# Patient Record
Sex: Female | Born: 2000 | Race: Black or African American | Hispanic: No | Marital: Single | Smoking: Never smoker
Health system: Southern US, Community
[De-identification: ages and names within clinical notes are randomized; demographics above are authoritative.]

## PROBLEM LIST (undated history)

## (undated) DIAGNOSIS — M549 Dorsalgia, unspecified: Secondary | ICD-10-CM

## (undated) DIAGNOSIS — R519 Headache, unspecified: Secondary | ICD-10-CM

## (undated) DIAGNOSIS — R51 Headache: Secondary | ICD-10-CM

## (undated) DIAGNOSIS — T50902D Poisoning by unspecified drugs, medicaments and biological substances, intentional self-harm, subsequent encounter: Secondary | ICD-10-CM

## (undated) DIAGNOSIS — R252 Cramp and spasm: Secondary | ICD-10-CM

---
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
## 2021-01-29 ENCOUNTER — Ambulatory Visit (HOSPITAL_COMMUNITY)
Admission: EM | Admit: 2021-01-29 | Discharge: 2021-01-29 | Disposition: A | Discharge status: Home / Self Care | Payer: Federal, State, Local not specified - PPO | Attending: Internal Medicine | Admitting: Internal Medicine

## 2021-01-29 ENCOUNTER — Encounter (HOSPITAL_COMMUNITY): Payer: Self-pay

## 2021-01-29 ENCOUNTER — Other Ambulatory Visit: Payer: Self-pay

## 2021-01-29 DIAGNOSIS — J302 Other seasonal allergic rhinitis: Secondary | ICD-10-CM | POA: Diagnosis present

## 2021-01-29 DIAGNOSIS — Z1152 Encounter for screening for COVID-19: Secondary | ICD-10-CM | POA: Diagnosis present

## 2021-01-29 HISTORY — DX: Dorsalgia, unspecified: M54.9

## 2021-01-29 HISTORY — DX: Cramp and spasm: R25.2

## 2021-01-29 NOTE — ED Triage Notes (Signed)
Pt c/o headache, itchy throat and congestion since yesterday evening. States flu/covid have been going around at her workplace. States did home covid test yesterday and saw possible faded positive line on test.

## 2021-01-29 NOTE — Discharge Instructions (Addendum)
As we discussed, I feel your symptoms are related to seasonal allergies.  Please begin taking over-the-counter Claritin (ok for generic brand) OR Zyrtec once daily while symptoms persist.  To be extra cautious, I will screen you today for COVID-19.  Results should be available in your MyChart tomorrow.  If COVID-positive then you will need to isolate as we discussed.

## 2021-01-29 NOTE — ED Notes (Signed)
While reviewing medical history pt states that she gets cramping in her hands, cramps in her legs, and numbness in right leg only. Pt states she has been seen by her primary care doctor for all of these and they have not found the cause.

## 2021-01-29 NOTE — ED Provider Notes (Signed)
[facility]    CSN: [id number] Arrival date & time: 01/29/21  0808      History   Chief Complaint Chief Complaint  Patient presents with  . Headache  . Nasal Congestion  . itchy throat    HPI Kylie Bishop is a 20 y.o. female otherwise healthy presents to urgent care today with complaints of headache, rhinorrhea, sneezing and scratchy throat.  Patient reports sudden onset of symptoms while at work yesterday.  Patient denies any recent fever or chills, chest pain, shortness of breath, cough.  Patient with no known COVID exposure but does work at a [employer].   Past Medical History:  Diagnosis Date  . Back pain   . Cramp in limb    pt reports being seen in past for cramping in both legs, numbness in right leg  . Cramping of hands   . Headache   . Suicidal overdose, subsequent encounter 03/29/2017    Patient Active Problem List   Diagnosis Date Noted  . Suicidal overdose, subsequent encounter 03/29/2017  . MDD (major depressive disorder), recurrent episode, severe (HCC) 03/28/2017    History reviewed. No pertinent surgical history.  OB History   No obstetric history on file.      Home Medications    Prior to Admission medications   Medication Sig Start Date End Date Taking? Authorizing Provider  FLUoxetine (PROZAC) 20 MG capsule Take 1 capsule (20 mg total) by mouth daily. 04/01/17  Yes Thedora Hinders, MD  ibuprofen (ADVIL,MOTRIN) 400 MG tablet Take 1 tablet (400 mg total) by mouth every 6 (six) hours as needed for headache, mild pain or moderate pain. 12/29/17  Yes Scoville, Nadara Mustard, NP    Family History Family History  Problem Relation Age of Onset  . Healthy Mother   . Healthy Father     Social History Social History   Tobacco Use  . Smoking status: Never Smoker  . Smokeless tobacco: Never Used  Substance Use Topics  . Alcohol use: Yes    Comment: occasionally  . Drug use: No     Allergies   Patient has no known  allergies.   Review of Systems As stated in HPI otherwise negative   Physical Exam Triage Vital Signs ED Triage Vitals  Enc Vitals Group     BP 01/29/21 0826 108/72     Pulse Rate 01/29/21 0826 70     Resp 01/29/21 0826 18     Temp 01/29/21 0826 98.1 F (36.7 C)     Temp src --      SpO2 01/29/21 0826 100 %     Weight --      Height --      Head Circumference --      Peak Flow --      Pain Score 01/29/21 0821 0     Pain Loc --      Pain Edu? --      Excl. in GC? --    No data found.  Updated Vital Signs BP 108/72   Pulse 70   Temp 98.1 F (36.7 C)   Resp 18   LMP 01/08/2021   SpO2 100%   Visual Acuity Right Eye Distance:   Left Eye Distance:   Bilateral Distance:    Right Eye Near:   Left Eye Near:    Bilateral Near:     Physical Exam Constitutional:      General: She is not in acute distress.  Appearance: She is well-developed and normal weight. She is not ill-appearing or toxic-appearing.  HENT:     Head: Normocephalic.     Mouth/Throat:     Mouth: Mucous membranes are moist.     Comments: Mild posterior pharyngeal erythema without any swelling or exudate.  No suspicious lesions Eyes:     General: No scleral icterus.    Extraocular Movements: Extraocular movements intact.  Cardiovascular:     Rate and Rhythm: Normal rate and regular rhythm.  Pulmonary:     Effort: Pulmonary effort is normal.     Breath sounds: Normal breath sounds. No wheezing, rhonchi or rales.  Abdominal:     General: Bowel sounds are normal.     Palpations: Abdomen is soft.     Tenderness: There is no abdominal tenderness.  Musculoskeletal:     Cervical back: Normal range of motion and neck supple.  Lymphadenopathy:     Cervical: No cervical adenopathy.  Skin:    General: Skin is warm and dry.  Neurological:     Mental Status: She is alert.  Psychiatric:        Mood and Affect: Mood normal.        Behavior: Behavior normal.      UC Treatments / Results   Labs (all labs ordered are listed, but only abnormal results are displayed) Labs Reviewed  SARS CORONAVIRUS 2 (TAT 6-24 HRS)    EKG   Radiology No results found.  Procedures Procedures (including critical care time)  Medications Ordered in UC Medications - No data to display  Initial Impression / Assessment and Plan / UC Course  I have reviewed the triage vital signs and the nursing notes.  Pertinent labs & imaging results that were available during my care of the patient were reviewed by me and considered in my medical decision making (see chart for details).  Allergic rhinitis -Onset of symptoms day prior with frontal headache, rhinorrhea, sneezing and itchy throat. -History and exam consistent with allergic rhinitis.  No findings suggestive of bacterial etiology.  Patient afebrile and nontoxic-appearing -Low suspicion for COVID-19 though will screen based on recommendations.  Isolation precautions discussed -OTC Claritin or Zyrtec daily -f/u up PCP or return for worsening symptoms  Final Clinical Impressions(s) / UC Diagnoses   Final diagnoses:  Seasonal allergic rhinitis, unspecified trigger  Encounter for screening for COVID-19     Discharge Instructions     As we discussed, I feel your symptoms are related to seasonal allergies.  Please begin taking over-the-counter Claritin (ok for generic brand) OR Zyrtec once daily while symptoms persist.  To be extra cautious, I will screen you today for COVID-19.  Results should be available in your MyChart tomorrow.  If COVID-positive then you will need to isolate as we discussed.    ED Prescriptions    None     PDMP not reviewed this encounter.   Rolla Etienne, NP 01/29/21 [id number]

## 2021-01-30 LAB — SARS CORONAVIRUS 2 (TAT 6-24 HRS): SARS Coronavirus 2: POSITIVE — AB

## 2021-08-01 ENCOUNTER — Ambulatory Visit (HOSPITAL_COMMUNITY)
Admission: EM | Admit: 2021-08-01 | Discharge: 2021-08-01 | Disposition: A | Discharge status: Home / Self Care | Payer: Federal, State, Local not specified - PPO | Attending: Student | Admitting: Student

## 2021-08-01 ENCOUNTER — Encounter (HOSPITAL_COMMUNITY): Payer: Self-pay | Admitting: Emergency Medicine

## 2021-08-01 ENCOUNTER — Other Ambulatory Visit: Payer: Self-pay

## 2021-08-01 DIAGNOSIS — R42 Dizziness and giddiness: Secondary | ICD-10-CM | POA: Diagnosis not present

## 2021-08-01 LAB — CBG MONITORING, ED: Glucose-Capillary: 121 mg/dL — ABNORMAL HIGH (ref 70–99)

## 2021-08-01 LAB — POC INFLUENZA A AND B ANTIGEN (URGENT CARE ONLY)
INFLUENZA A ANTIGEN, POC: POSITIVE — AB
INFLUENZA B ANTIGEN, POC: NEGATIVE

## 2021-08-01 NOTE — Discharge Instructions (Addendum)
-  Please follow-up with your PCP if symptoms persist, or ED if symptoms worsen

## 2021-08-01 NOTE — ED Triage Notes (Signed)
Wile at work started feeling weak and dizzy.  She sat down and then hands and feet were numb.  Reports feeling "really weird" all over

## 2021-08-01 NOTE — ED Provider Notes (Signed)
[place]    CSN: [id number] Arrival date & time: 08/01/21  1549      History   Chief Complaint Chief Complaint  Patient presents with   Dizziness   Weakness    HPI BRILYN PHON is a 20 y.o. female presenting with dizziness and weakness x6 hours.  Describes 6 hours of dizziness and weakness that started while she was at work.  States she was overcome with body aches and tingling in the feet, she sat down and the episode passed though she still does have some tingling.  Has not taken any medications for the symptoms.  Feeling well otherwise, denies fever/chills, cough, congestion., SOB, CP.   HPI  Past Medical History:  Diagnosis Date   Back pain    Cramp in limb    pt reports being seen in past for cramping in both legs, numbness in right leg   Cramping of hands    Headache    Suicidal overdose, subsequent encounter 03/29/2017    Patient Active Problem List   Diagnosis Date Noted   Suicidal overdose, subsequent encounter 03/29/2017   MDD (major depressive disorder), recurrent episode, severe ([place]) 03/28/2017    History reviewed. No pertinent surgical history.  OB History   No obstetric history on file.      Home Medications    Prior to Admission medications   Medication Sig Start Date End Date Taking? Authorizing Provider  FLUoxetine (PROZAC) 20 MG capsule Take 1 capsule (20 mg total) by mouth daily. Patient not taking: Reported on 08/01/2021 04/01/17   Philipp Ovens, MD  ibuprofen (ADVIL,MOTRIN) 400 MG tablet Take 1 tablet (400 mg total) by mouth every 6 (six) hours as needed for headache, mild pain or moderate pain. 12/29/17   Jean Rosenthal, NP    Family History Family History  Problem Relation Age of Onset   Healthy Mother    Healthy Father     Social History Social History   Tobacco Use   Smoking status: Never   Smokeless tobacco: Never  Vaping Use   Vaping Use: Never used  Substance Use Topics   Alcohol use:  Yes    Comment: occasionally   Drug use: No     Allergies   Patient has no known allergies.   Review of Systems Review of Systems  Constitutional:  Negative for appetite change, chills and fever.  HENT:  Negative for congestion, ear pain, rhinorrhea, sinus pressure, sinus pain and sore throat.   Eyes:  Negative for redness and visual disturbance.  Respiratory:  Negative for cough, chest tightness, shortness of breath and wheezing.   Cardiovascular:  Negative for chest pain and palpitations.  Gastrointestinal:  Negative for abdominal pain, constipation, diarrhea, nausea and vomiting.  Genitourinary:  Negative for dysuria, frequency and urgency.  Musculoskeletal:  Negative for myalgias.  Neurological:  Positive for dizziness. Negative for weakness and headaches.  Psychiatric/Behavioral:  Negative for confusion.   All other systems reviewed and are negative.   Physical Exam Triage Vital Signs ED Triage Vitals  Enc Vitals Group     BP 08/01/21 1553 129/81     Pulse Rate 08/01/21 1553 84     Resp 08/01/21 1553 (!) 22     Temp --      Temp src --      SpO2 08/01/21 1553 100 %     Weight --      Height --      Head Circumference --  Peak Flow --      Pain Score 08/01/21 1556 6     Pain Loc --      Pain Edu? --      Excl. in [place]? --    No data found.  Updated Vital Signs BP 129/81 (BP Location: Right Arm)   Pulse 84   Resp (!) 22   SpO2 100%   Visual Acuity Right Eye Distance:   Left Eye Distance:   Bilateral Distance:    Right Eye Near:   Left Eye Near:    Bilateral Near:     Physical Exam Vitals reviewed.  Constitutional:      General: She is not in acute distress.    Appearance: Normal appearance. She is not ill-appearing.  HENT:     Head: Normocephalic and atraumatic.     Right Ear: Tympanic membrane, ear canal and external ear normal. No tenderness. No middle ear effusion. There is no impacted cerumen. Tympanic membrane is not perforated,  erythematous, retracted or bulging.     Left Ear: Tympanic membrane, ear canal and external ear normal. No tenderness.  No middle ear effusion. There is no impacted cerumen. Tympanic membrane is not perforated, erythematous, retracted or bulging.     Nose: Nose normal. No congestion.     Mouth/Throat:     Mouth: Mucous membranes are moist.     Pharynx: Uvula midline. No oropharyngeal exudate or posterior oropharyngeal erythema.  Eyes:     Extraocular Movements: Extraocular movements intact.     Pupils: Pupils are equal, round, and reactive to light.  Cardiovascular:     Rate and Rhythm: Normal rate and regular rhythm.     Heart sounds: Normal heart sounds.  Pulmonary:     Effort: Pulmonary effort is normal.     Breath sounds: Normal breath sounds. No decreased breath sounds, wheezing, rhonchi or rales.  Abdominal:     Palpations: Abdomen is soft.     Tenderness: There is no abdominal tenderness. There is no guarding or rebound.  Lymphadenopathy:     Cervical: No cervical adenopathy.     Right cervical: No superficial cervical adenopathy.    Left cervical: No superficial cervical adenopathy.  Neurological:     General: No focal deficit present.     Mental Status: She is alert and oriented to person, place, and time.     Comments: CN 2-12 grossly intact, PERRLA, EOMI, negative rhomberg, negative fingers to thumb  Psychiatric:        Mood and Affect: Mood normal.        Behavior: Behavior normal.        Thought Content: Thought content normal.        Judgment: Judgment normal.     UC Treatments / Results  Labs (all labs ordered are listed, but only abnormal results are displayed) Labs Reviewed  POC INFLUENZA A AND B ANTIGEN (URGENT CARE ONLY) - Abnormal; Notable for the following components:      Result Value   INFLUENZA A ANTIGEN, POC POSITIVE (*)    All other components within normal limits  CBG MONITORING, ED - Abnormal; Notable for the following components:    Glucose-Capillary 121 (*)    All other components within normal limits    EKG   Radiology No results found.  Procedures Procedures (including critical care time)  Medications Ordered in UC Medications - No data to display  Initial Impression / Assessment and Plan / UC Course  I have reviewed the  triage vital signs and the nursing notes.  Pertinent labs & imaging results that were available during my care of the patient were reviewed by me and considered in my medical decision making (see chart for details).     This patient is a very pleasant 20 y.o. year old female presenting with influenza A. Today this pt is afebrile nontachycardic nontachypneic, oxygenating well on room air, no wheezes rhonchi or rales.   ED return precautions discussed. Patient verbalizes understanding and agreement.  .   Final Clinical Impressions(s) / UC Diagnoses   Final diagnoses:  Lightheaded     Discharge Instructions      -Please follow-up with your PCP if symptoms persist, or ED if symptoms worsen   ED Prescriptions   None    PDMP not reviewed this encounter.   Rhys Martini, PA-C 08/01/21 [phone]

## 2022-02-26 ENCOUNTER — Other Ambulatory Visit: Payer: Self-pay

## 2022-02-26 ENCOUNTER — Encounter (HOSPITAL_BASED_OUTPATIENT_CLINIC_OR_DEPARTMENT_OTHER): Payer: Self-pay | Admitting: *Deleted

## 2022-02-26 DIAGNOSIS — S134XXA Sprain of ligaments of cervical spine, initial encounter: Secondary | ICD-10-CM | POA: Diagnosis not present

## 2022-02-26 DIAGNOSIS — S199XXA Unspecified injury of neck, initial encounter: Secondary | ICD-10-CM | POA: Diagnosis present

## 2022-02-26 DIAGNOSIS — Y92410 Unspecified street and highway as the place of occurrence of the external cause: Secondary | ICD-10-CM | POA: Diagnosis not present

## 2022-02-26 DIAGNOSIS — S339XXA Sprain of unspecified parts of lumbar spine and pelvis, initial encounter: Secondary | ICD-10-CM | POA: Insufficient documentation

## 2022-02-26 DIAGNOSIS — S29019A Strain of muscle and tendon of unspecified wall of thorax, initial encounter: Secondary | ICD-10-CM | POA: Diagnosis not present
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
## 2022-02-27 ENCOUNTER — Emergency Department (HOSPITAL_BASED_OUTPATIENT_CLINIC_OR_DEPARTMENT_OTHER): Payer: Federal, State, Local not specified - PPO | Admitting: Radiology

## 2022-02-27 ENCOUNTER — Emergency Department (HOSPITAL_BASED_OUTPATIENT_CLINIC_OR_DEPARTMENT_OTHER)
Admission: EM | Admit: 2022-02-27 | Discharge: 2022-02-27 | Disposition: A | Discharge status: Home / Self Care | Payer: Federal, State, Local not specified - PPO | Attending: Emergency Medicine | Admitting: Emergency Medicine

## 2022-02-27 DIAGNOSIS — S139XXA Sprain of joints and ligaments of unspecified parts of neck, initial encounter: Secondary | ICD-10-CM

## 2022-02-27 DIAGNOSIS — S335XXA Sprain of ligaments of lumbar spine, initial encounter: Secondary | ICD-10-CM

## 2022-02-27 DIAGNOSIS — S29019A Strain of muscle and tendon of unspecified wall of thorax, initial encounter: Secondary | ICD-10-CM

## 2022-02-27 LAB — HCG, SERUM, QUALITATIVE: Preg, Serum: NEGATIVE
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
## 2022-02-27 NOTE — Discharge Instructions (Signed)
Apply ice for 30 minutes at a time, 4 times a day.  Take ibuprofen or naproxen as needed for pain.  If you need additional pain relief, add acetaminophen.  When you combine acetaminophen and ibuprofen or naproxen, you get better pain relief and you get from taking either medication by itself.
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
## 2022-07-05 ENCOUNTER — Ambulatory Visit (HOSPITAL_COMMUNITY)
Admission: EM | Admit: 2022-07-05 | Discharge: 2022-07-05 | Disposition: A | Discharge status: Home / Self Care | Payer: Federal, State, Local not specified - PPO | Attending: Internal Medicine | Admitting: Internal Medicine

## 2022-07-05 ENCOUNTER — Encounter (HOSPITAL_COMMUNITY): Payer: Self-pay | Admitting: Emergency Medicine

## 2022-07-05 DIAGNOSIS — N3001 Acute cystitis with hematuria: Secondary | ICD-10-CM | POA: Diagnosis present

## 2022-07-05 DIAGNOSIS — J300 Vasomotor rhinitis: Secondary | ICD-10-CM | POA: Insufficient documentation

## 2022-07-05 LAB — POCT URINALYSIS DIPSTICK, ED / UC
Glucose, UA: NEGATIVE mg/dL
Ketones, ur: 40 mg/dL — AB
Nitrite: NEGATIVE
Protein, ur: 100 mg/dL — AB
Specific Gravity, Urine: 1.015 (ref 1.005–1.030)
Urobilinogen, UA: 0.2 mg/dL (ref 0.0–1.0)
pH: 6.5 (ref 5.0–8.0)

## 2022-07-05 MED ORDER — FLUTICASONE PROPIONATE 50 MCG/ACT NA SUSP: 1.0000 | Freq: Every day | NASAL | 0 refills | Status: AC

## 2022-07-05 MED ORDER — CEPHALEXIN 500 MG PO CAPS: 500.0000 mg | ORAL_CAPSULE | Freq: Two times a day (BID) | ORAL | 0 refills | Status: AC

## 2022-07-05 NOTE — ED Triage Notes (Signed)
Around Thursday finished menstrual cycle and having urinary frequency and spotting. Denies pain with urination. Pt also c/o sinus symptoms that started on Saturday. Took nasal decongestant OTC starting yesterday.

## 2022-07-05 NOTE — Discharge Instructions (Addendum)
Please use medications as prescribed Increase oral fluid intake Humidifier use will help with nasal congestion No indication for viral testing Return to urgent care if symptoms worsen.

## 2022-07-06 LAB — URINE CULTURE

## 2022-07-06 NOTE — ED Provider Notes (Signed)
[facility]    CSN: [id number] Arrival date & time: 07/05/22  1001      History   Chief Complaint Chief Complaint  Patient presents with   Urinary Frequency   Facial Pain    HPI Kylie Bishop is a 21 y.o. female comes to urgent care with urinary frequency and blood in urine.  Patient recently finished her menses.  No dysuria.  No fever or chills.  No flank pain.  No nausea or vomiting.    Patient also complains of nasal congestion with no fever or chills.  No sore throat.  No generalized body aches.  Symptoms started yesterday after she slept with the windows open and the fan blowing on her face.  She took nasal decongestant with no improvement in symptoms.  Nasal congestion started yesterday.  Patient denies tobacco use or toxic inhalational exposure. HPI  Past Medical History:  Diagnosis Date   Back pain    Cramp in limb    pt reports being seen in past for cramping in both legs, numbness in right leg   Cramping of hands    Headache    Suicidal overdose, subsequent encounter 03/29/2017    Patient Active Problem List   Diagnosis Date Noted   Suicidal overdose, subsequent encounter 03/29/2017   MDD (major depressive disorder), recurrent episode, severe ([place]) 03/28/2017    History reviewed. No pertinent surgical history.  OB History   No obstetric history on file.      Home Medications    Prior to Admission medications   Medication Sig Start Date End Date Taking? Authorizing Provider  cephALEXin (KEFLEX) 500 MG capsule Take 1 capsule (500 mg total) by mouth 2 (two) times daily for 5 days. 07/05/22 07/10/22 Yes Grettel Rames, Myrene Galas, MD  fluticasone (FLONASE) 50 MCG/ACT nasal spray Place 1 spray into both nostrils daily. 07/05/22  Yes Shanaye Rief, Myrene Galas, MD  gabapentin (NEURONTIN) 300 MG capsule Take 1 capsule by mouth as needed. 09/07/21  Yes [provider]  norelgestromin-ethinyl estradiol [name]) 150-35 MCG/24HR transdermal patch Place 1 patch  onto the skin once a week. 05/24/22  Yes [provider]  ibuprofen (ADVIL,MOTRIN) 400 MG tablet Take 1 tablet (400 mg total) by mouth every 6 (six) hours as needed for headache, mild pain or moderate pain. 12/29/17   Jean Rosenthal, NP    Family History Family History  Problem Relation Age of Onset   Healthy Mother    Healthy Father     Social History Social History   Tobacco Use   Smoking status: Never   Smokeless tobacco: Never  Vaping Use   Vaping Use: Never used  Substance Use Topics   Alcohol use: Yes    Comment: occasionally   Drug use: No     Allergies   Patient has no known allergies.   Review of Systems Review of Systems  Constitutional: Negative.   HENT:  Positive for congestion. Negative for sinus pressure, sinus pain and sore throat.   Respiratory:  Negative for apnea, cough and shortness of breath.   Genitourinary:  Positive for frequency, hematuria and urgency. Negative for difficulty urinating and dysuria.  Neurological: Negative.      Physical Exam Triage Vital Signs ED Triage Vitals  Enc Vitals Group     BP 07/05/22 1132 113/66     Pulse Rate 07/05/22 1132 96     Resp 07/05/22 1132 15     Temp 07/05/22 1132 99.9 F (37.7 C)  Temp Source 07/05/22 1132 Oral     SpO2 07/05/22 1132 98 %     Weight --      Height --      Head Circumference --      Peak Flow --      Pain Score 07/05/22 1129 0     Pain Loc --      Pain Edu? --      Excl. in [place]? --    No data found.  Updated Vital Signs BP 113/66 (BP Location: Right Arm)   Pulse 96   Temp 99.9 F (37.7 C) (Oral)   Resp 15   LMP 06/27/2022   SpO2 98%   Visual Acuity Right Eye Distance:   Left Eye Distance:   Bilateral Distance:    Right Eye Near:   Left Eye Near:    Bilateral Near:     Physical Exam Vitals and nursing note reviewed.  Constitutional:      General: She is not in acute distress.    Appearance: She is not ill-appearing.  HENT:     Mouth/Throat:      Pharynx: No posterior oropharyngeal erythema.  Eyes:     Pupils: Pupils are equal, round, and reactive to light.  Cardiovascular:     Rate and Rhythm: Normal rate and regular rhythm.     Pulses: Normal pulses.     Heart sounds: Normal heart sounds.  Pulmonary:     Effort: Pulmonary effort is normal.     Breath sounds: Normal breath sounds.  Abdominal:     General: Bowel sounds are normal.     Palpations: Abdomen is soft.  Skin:    General: Skin is warm.  Neurological:     Mental Status: She is alert.      UC Treatments / Results  Labs (all labs ordered are listed, but only abnormal results are displayed) Labs Reviewed  URINE CULTURE - Abnormal; Notable for the following components:      Result Value   Culture MULTIPLE SPECIES PRESENT, SUGGEST RECOLLECTION (*)    All other components within normal limits  POCT URINALYSIS DIPSTICK, ED / UC - Abnormal; Notable for the following components:   Bilirubin Urine SMALL (*)    Ketones, ur 40 (*)    Hgb urine dipstick LARGE (*)    Protein, ur 100 (*)    Leukocytes,Ua MODERATE (*)    All other components within normal limits    EKG   Radiology No results found.  Procedures Procedures (including critical care time)  Medications Ordered in UC Medications - No data to display  Initial Impression / Assessment and Plan / UC Course  I have reviewed the triage vital signs and the nursing notes.  Pertinent labs & imaging results that were available during my care of the patient were reviewed by me and considered in my medical decision making (see chart for details).     1.  Vasomotor rhinitis: Fluticasone nasal spray Humidifier use will help with nasal congestion No indication for COVID or flu testing Return precautions given  2.  Acute cystitis with hematuria: Point-of-care urinalysis is remarkable for hemoglobin, moderate leukocyte esterase Urine cultures have been sent Patient is advised to increase oral fluid  intake Keflex 500 mg twice daily for 5 days Return precautions given.  Final Clinical Impressions(s) / UC Diagnoses   Final diagnoses:  Vasomotor rhinitis  Acute cystitis with hematuria     Discharge Instructions      Please use  medications as prescribed Increase oral fluid intake Humidifier use will help with nasal congestion No indication for viral testing Return to urgent care if symptoms worsen.   ED Prescriptions     Medication Sig Dispense Auth. Provider   fluticasone (FLONASE) 50 MCG/ACT nasal spray Place 1 spray into both nostrils daily. 16 g Rashi Giuliani, Britta Mccreedy, MD   cephALEXin (KEFLEX) 500 MG capsule Take 1 capsule (500 mg total) by mouth 2 (two) times daily for 5 days. 10 capsule Drayden Lukas, Britta Mccreedy, MD      PDMP not reviewed this encounter.   Merrilee Jansky, MD 07/06/22 [phone]

## 2022-10-09 ENCOUNTER — Telehealth (HOSPITAL_COMMUNITY): Payer: Self-pay | Admitting: Family Medicine

## 2022-10-09 ENCOUNTER — Encounter (HOSPITAL_COMMUNITY): Payer: Self-pay

## 2022-10-09 ENCOUNTER — Telehealth (HOSPITAL_COMMUNITY): Payer: Self-pay | Admitting: Emergency Medicine

## 2022-10-09 ENCOUNTER — Ambulatory Visit (HOSPITAL_COMMUNITY)
Admission: EM | Admit: 2022-10-09 | Discharge: 2022-10-09 | Disposition: A | Discharge status: Home / Self Care | Payer: Federal, State, Local not specified - PPO | Attending: Family Medicine | Admitting: Family Medicine

## 2022-10-09 DIAGNOSIS — J069 Acute upper respiratory infection, unspecified: Secondary | ICD-10-CM

## 2022-10-09 DIAGNOSIS — J029 Acute pharyngitis, unspecified: Secondary | ICD-10-CM | POA: Diagnosis not present

## 2022-10-09 LAB — POCT RAPID STREP A, ED / UC: Streptococcus, Group A Screen (Direct): NEGATIVE

## 2022-10-09 MED ORDER — PROMETHAZINE-DM 6.25-15 MG/5ML PO SYRP: 5.0000 mL | ORAL_SOLUTION | Freq: Four times a day (QID) | ORAL | 0 refills | Status: AC | PRN

## 2022-10-09 MED ORDER — LIDOCAINE VISCOUS HCL 2 % MT SOLN: 5.0000 mL | Freq: Four times a day (QID) | OROMUCOSAL | 0 refills | Status: DC | PRN

## 2022-10-09 MED ORDER — LIDOCAINE VISCOUS HCL 2 % MT SOLN: 5.0000 mL | Freq: Four times a day (QID) | OROMUCOSAL | 0 refills | Status: AC | PRN

## 2022-10-09 NOTE — ED Triage Notes (Signed)
Pt is here for chest congestion, sore throat, body aches, gassy, cough x3 days

## 2022-10-09 NOTE — ED Provider Notes (Signed)
[place]   [id number] 10/09/22 Arrival Time: 1026  ASSESSMENT & PLAN:  1. Sore throat   2. Viral URI with cough    Discussed typical duration of likely viral illness. Results for orders placed or performed during the hospital encounter of 10/09/22  POCT Rapid Strep A (ED/UC)  Result Value Ref Range   Streptococcus, Group A Screen (Direct) NEGATIVE NEGATIVE  Throat culture sent/pending.  OTC symptom care as needed.  Meds ordered this encounter  Medications   magic mouthwash (lidocaine, diphenhydrAMINE, alum & mag hydroxide) suspension    Sig: Swish and spit 5 mLs 4 (four) times daily as needed for mouth pain.    Dispense:  360 mL    Refill:  0   promethazine-dextromethorphan (PROMETHAZINE-DM) 6.25-15 MG/5ML syrup    Sig: Take 5 mLs by mouth 4 (four) times daily as needed for cough.    Dispense:  118 mL    Refill:  0     Discharge Instructions      You may use over the counter ibuprofen or acetaminophen as needed.  For a sore throat, over the counter products such as [employer] Peroxyl Mouth Sore Rinse or Chloraseptic Sore Throat Spray may provide some temporary relief. Your rapid strep test was negative today. We have sent your throat swab for culture and will let you know of any positive results.     Reviewed expectations re: course of current medical issues. Questions answered. Outlined signs and symptoms indicating need for more acute intervention. Understanding verbalized. After Visit Summary given.   SUBJECTIVE: History from: Patient. Kylie Bishop is a 22 y.o. female. Reports: chest congestion, sore throat, body aches, gassy, cough; abrupt onset; x3 days. Denies: difficulty breathing. Normal PO intake without n/v/d. Works as Chief Operating Officer.  OBJECTIVE:  Vitals:   10/09/22 1114  BP: 103/69  Pulse: 76  Resp: 12  Temp: 98.7 F (37.1 C)  TempSrc: Oral  SpO2: 97%   General appearance: alert; no distress Eyes: PERRLA; EOMI; conjunctiva  normal HENT: [place]; AT; with nasal congestion; throat mild mild to moderate erythema and cobblestoning Neck: supple s LAD Lungs: speaks full sentences without difficulty; unlabored; clear; dry cough Extremities: no edema Skin: warm and dry Neurologic: normal gait Psychological: alert and cooperative; normal mood and affect   No Known Allergies  Past Medical History:  Diagnosis Date   Back pain    Cramp in limb    pt reports being seen in past for cramping in both legs, numbness in right leg   Cramping of hands    Headache    Suicidal overdose, subsequent encounter 03/29/2017   Social History   Socioeconomic History   Marital status: Single    Spouse name: Not on file   Number of children: Not on file   Years of education: Not on file   Highest education level: Not on file  Occupational History   Not on file  Tobacco Use   Smoking status: Never   Smokeless tobacco: Never  Vaping Use   Vaping Use: Never used  Substance and Sexual Activity   Alcohol use: Yes    Comment: occasionally   Drug use: No   Sexual activity: Yes    Birth control/protection: Patch  Other Topics Concern   Not on file  Social History Narrative   Not on file   Social Determinants of Health   Financial Resource Strain: Not on file  Food Insecurity: Not on file  Transportation Needs: Not on file  Physical  Activity: Not on file  Stress: Not on file  Social Connections: Not on file  Intimate Partner Violence: Not on file   Family History  Problem Relation Age of Onset   Healthy Mother    Healthy Father    History reviewed. No pertinent surgical history.   Vanessa Kick, MD 10/09/22 [phone]

## 2022-10-09 NOTE — Discharge Instructions (Addendum)
You may use over the counter ibuprofen or acetaminophen as needed.  For a sore throat, over the counter products such as Colgate Peroxyl Mouth Sore Rinse or Chloraseptic Sore Throat Spray may provide some temporary relief. Your rapid strep test was negative today. We have sent your throat swab for culture and will let you know of any positive results. 

## 2022-10-09 NOTE — Telephone Encounter (Signed)
Re-sent: Meds ordered this encounter  Medications   magic mouthwash (lidocaine, diphenhydrAMINE, alum & mag hydroxide) suspension    Sig: Swish and spit 5 mLs 4 (four) times daily as needed for mouth pain.    Dispense:  360 mL    Refill:  0   To pharmacy on file; pt request.

## 2022-10-09 NOTE — Telephone Encounter (Signed)
Pt called stating that pharmacy does not have Rx magic mouthwash in stock and she will need it sent to another pharmacy. Pt chose [facility] [address]. Rx sent. No further needs at this time.

## 2022-10-12 LAB — CULTURE, GROUP A STREP (THRC)

## 2023-03-11 ENCOUNTER — Emergency Department (HOSPITAL_COMMUNITY)
Admission: EM | Admit: 2023-03-11 | Discharge: 2023-03-11 | Disposition: A | Discharge status: Home / Self Care | Payer: Federal, State, Local not specified - PPO | Attending: Emergency Medicine | Admitting: Emergency Medicine

## 2023-03-11 ENCOUNTER — Other Ambulatory Visit: Payer: Self-pay

## 2023-03-11 ENCOUNTER — Encounter (HOSPITAL_COMMUNITY): Payer: Self-pay | Admitting: Emergency Medicine

## 2023-03-11 DIAGNOSIS — N3001 Acute cystitis with hematuria: Secondary | ICD-10-CM | POA: Diagnosis not present

## 2023-03-11 DIAGNOSIS — D72829 Elevated white blood cell count, unspecified: Secondary | ICD-10-CM | POA: Diagnosis not present

## 2023-03-11 DIAGNOSIS — R350 Frequency of micturition: Secondary | ICD-10-CM | POA: Diagnosis present

## 2023-03-11 LAB — URINALYSIS, ROUTINE W REFLEX MICROSCOPIC
Bilirubin Urine: NEGATIVE
Glucose, UA: NEGATIVE mg/dL
Ketones, ur: NEGATIVE mg/dL
Nitrite: NEGATIVE
Protein, ur: NEGATIVE mg/dL
Specific Gravity, Urine: 1.01 (ref 1.005–1.030)
pH: 7 (ref 5.0–8.0)

## 2023-03-11 LAB — URINALYSIS, MICROSCOPIC (REFLEX)

## 2023-03-11 LAB — PREGNANCY, URINE: Preg Test, Ur: NEGATIVE

## 2023-03-11 MED ORDER — PHENAZOPYRIDINE HCL 200 MG PO TABS: 200.0000 mg | ORAL_TABLET | Freq: Three times a day (TID) | ORAL | 0 refills | Status: AC | PRN

## 2023-03-11 MED ORDER — CEPHALEXIN 250 MG PO CAPS
500.0000 mg | ORAL_CAPSULE | Freq: Once | ORAL | Status: AC
  Administered 2023-03-11: 500 mg via ORAL
  Filled 2023-03-11: qty 2

## 2023-03-11 MED ORDER — CEFADROXIL 500 MG PO CAPS: 500.0000 mg | ORAL_CAPSULE | Freq: Two times a day (BID) | ORAL | 0 refills | Status: AC

## 2023-03-11 NOTE — ED Provider Notes (Signed)
[place] EMERGENCY DEPARTMENT AT [facility] Provider Note   CSN: [id number] Arrival date & time: 03/11/23  0413     History  Chief Complaint  Patient presents with   Urinary Frequency    Kylie Bishop is a 22 y.o. female.   Urinary Frequency   22 year old female presents emergency department with complaints of urinary frequency, dysuria.  Patient states that symptoms began around 11 PM last night.  States she is concerned about urinary tract infection.  Denies any fever, nausea, vomiting, flank pain, abdominal pain, vaginal symptoms, change in bowel habits.  Past medical history significant for major depressive disorder, suicidal overdose, headache  Home Medications Prior to Admission medications   Medication Sig Start Date End Date Taking? Authorizing Provider  cefadroxil (DURICEF) 500 MG capsule Take 1 capsule (500 mg total) by mouth 2 (two) times daily. 03/11/23  Yes Sherian Maroon A, PA  phenazopyridine (PYRIDIUM) 200 MG tablet Take 1 tablet (200 mg total) by mouth 3 (three) times daily as needed for pain. 03/11/23  Yes Sherian Maroon A, PA  fluticasone (FLONASE) 50 MCG/ACT nasal spray Place 1 spray into both nostrils daily. 07/05/22   LampteyBritta Mccreedy, MD  gabapentin (NEURONTIN) 300 MG capsule Take 1 capsule by mouth as needed. 09/07/21   [provider]  ibuprofen (ADVIL,MOTRIN) 400 MG tablet Take 1 tablet (400 mg total) by mouth every 6 (six) hours as needed for headache, mild pain or moderate pain. 12/29/17   Sherrilee Gilles, NP  magic mouthwash (lidocaine, diphenhydrAMINE, alum & mag hydroxide) suspension Swish and spit 5 mLs 4 (four) times daily as needed for mouth pain. 10/09/22   Mardella Layman, MD  norelgestromin-ethinyl estradiol [facility]) 150-35 MCG/24HR transdermal patch Place 1 patch onto the skin once a week. 05/24/22   [provider]  promethazine-dextromethorphan (PROMETHAZINE-DM) 6.25-15 MG/5ML syrup Take 5 mLs by mouth 4  (four) times daily as needed for cough. 10/09/22   Mardella Layman, MD      Allergies    Patient has no known allergies.    Review of Systems   Review of Systems  Genitourinary:  Positive for frequency.  All other systems reviewed and are negative.   Physical Exam Updated Vital Signs BP 120/77 (BP Location: Right Arm)   Pulse 71   Temp 98.6 F (37 C) (Oral)   Resp 16   Ht 5\' 2"  (1.575 m)   Wt 56.7 kg   LMP 02/28/2023   SpO2 100%   BMI 22.86 kg/m  Physical Exam Vitals and nursing note reviewed.  Constitutional:      General: She is not in acute distress.    Appearance: She is well-developed.  HENT:     Head: Normocephalic and atraumatic.  Eyes:     Conjunctiva/sclera: Conjunctivae normal.  Cardiovascular:     Rate and Rhythm: Normal rate and regular rhythm.     Heart sounds: No murmur heard. Pulmonary:     Effort: Pulmonary effort is normal. No respiratory distress.     Breath sounds: Normal breath sounds. No wheezing, rhonchi or rales.  Abdominal:     Palpations: Abdomen is soft.     Tenderness: There is no abdominal tenderness. There is no right CVA tenderness.  Musculoskeletal:        General: No swelling.     Cervical back: Neck supple.  Skin:    General: Skin is warm and dry.     Capillary Refill: Capillary refill takes less than 2 seconds.  Neurological:     Mental Status: She is alert.  Psychiatric:        Mood and Affect: Mood normal.     ED Results / Procedures / Treatments   Labs (all labs ordered are listed, but only abnormal results are displayed) Labs Reviewed  URINALYSIS, ROUTINE W REFLEX MICROSCOPIC - Abnormal; Notable for the following components:      Result Value   Hgb urine dipstick LARGE (*)    Leukocytes,Ua SMALL (*)    All other components within normal limits  URINALYSIS, MICROSCOPIC (REFLEX) - Abnormal; Notable for the following components:   Bacteria, UA FEW (*)    All other components within normal limits  PREGNANCY, URINE     EKG None  Radiology No results found.  Procedures Procedures    Medications Ordered in ED Medications  cephALEXin (KEFLEX) capsule 500 mg (has no administration in time range)    ED Course/ Medical Decision Making/ A&P                             Medical Decision Making Amount and/or Complexity of Data Reviewed Labs: ordered.   This patient presents to the ED for concern of urinary frequency, this involves an extensive number of treatment options, and is a complaint that carries with it a high risk of complications and morbidity.  The differential diagnosis includes cystitis, pyelonephritis, sepsis   Co morbidities that complicate the patient evaluation  See HPI   Additional history obtained:  Additional history obtained from EMR External records from outside source obtained and reviewed including hospital records   Lab Tests:  I Ordered, and personally interpreted labs.  The pertinent results include: UA significant for few bacteria, small leukocytes, 21-50 WBCs with 21-50 RBCs and large hemoglobin.  Urine pregnancy negative.   Imaging Studies ordered:  N/a   Cardiac Monitoring: / EKG:  The patient was maintained on a cardiac monitor.  I personally viewed and interpreted the cardiac monitored which showed an underlying rhythm of: Sinus rhythm   Consultations Obtained:  N/a   Problem List / ED Course / Critical interventions / Medication management  Cystitis I ordered medication including Keflex Reevaluation of the patient after these medicines showed that the patient stayed the same I have reviewed the patients home medicines and have made adjustments as needed   Social Determinants of Health:  Denies tobacco, illicit drug use   Test / Admission - Considered:  Cystitis Vitals signs within normal range and stable throughout visit. Laboratory studies significant for: See above 22 year old female presents emergency department complaints  of urinary frequency mild symptoms of dysuria.  Patient found with evidence of urinary tract infection.  No clinical suspicion for pyelonephritis, sepsis.  Will treat with oral antibiotics and recommend follow-up with primary care.  Patient overall well-appearing, afebrile in no acute distress.  Treatment plan discussed at length with patient and she acknowledged understanding was agreeable to said plan. Worrisome signs and symptoms were discussed with the patient, and the patient acknowledged understanding to return to the ED if noticed. Patient was stable upon discharge.          Final Clinical Impression(s) / ED Diagnoses Final diagnoses:  Acute cystitis with hematuria    Rx / DC Orders ED Discharge Orders          Ordered    cefadroxil (DURICEF) 500 MG capsule  2 times daily        03/11/23  0518    phenazopyridine (PYRIDIUM) 200 MG tablet  3 times daily PRN        03/11/23 0518              Peter Garter, PA 03/11/23 1610    Mardene Sayer, MD 03/11/23 1649

## 2023-03-11 NOTE — ED Triage Notes (Signed)
Patient endorses urinary frequency and pain that started last night.

## 2023-03-11 NOTE — Discharge Instructions (Signed)
As discussed, your urine looked infected.  This is consistent with urinary tract infection.  Will treat this with antibiotics to take twice daily for the next 5 days.  I also sent a medicine called Pyridium to take as needed for burning type sensation.  Recommend follow-up with primary care for reassessment of your symptoms.  Please not hesitate to return to emergency department the worrisome signs and symptoms we discussed become apparent.
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
## 2024-03-09 ENCOUNTER — Encounter (HOSPITAL_BASED_OUTPATIENT_CLINIC_OR_DEPARTMENT_OTHER): Payer: Self-pay | Admitting: Emergency Medicine

## 2024-03-09 ENCOUNTER — Emergency Department (HOSPITAL_BASED_OUTPATIENT_CLINIC_OR_DEPARTMENT_OTHER)
Admission: EM | Admit: 2024-03-09 | Discharge: 2024-03-09 | Disposition: A | Discharge status: Home / Self Care | Attending: Emergency Medicine | Admitting: Emergency Medicine

## 2024-03-09 ENCOUNTER — Other Ambulatory Visit: Payer: Self-pay

## 2024-03-09 DIAGNOSIS — Z3A27 27 weeks gestation of pregnancy: Secondary | ICD-10-CM | POA: Diagnosis not present

## 2024-03-09 DIAGNOSIS — O26892 Other specified pregnancy related conditions, second trimester: Secondary | ICD-10-CM | POA: Diagnosis present

## 2024-03-09 DIAGNOSIS — O99891 Other specified diseases and conditions complicating pregnancy: Secondary | ICD-10-CM | POA: Insufficient documentation

## 2024-03-09 DIAGNOSIS — R110 Nausea: Secondary | ICD-10-CM | POA: Diagnosis not present

## 2024-03-09 DIAGNOSIS — Z3493 Encounter for supervision of normal pregnancy, unspecified, third trimester: Secondary | ICD-10-CM

## 2024-03-09 DIAGNOSIS — R109 Unspecified abdominal pain: Secondary | ICD-10-CM | POA: Diagnosis not present

## 2024-03-09 LAB — CBC
HCT: 33.4 % — ABNORMAL LOW (ref 36.0–46.0)
Hemoglobin: 11.9 g/dL — ABNORMAL LOW (ref 12.0–15.0)
MCH: 31.4 pg (ref 26.0–34.0)
MCHC: 35.6 g/dL (ref 30.0–36.0)
MCV: 88.1 fL (ref 80.0–100.0)
Platelets: 269 10*3/uL (ref 150–400)
RBC: 3.79 MIL/uL — ABNORMAL LOW (ref 3.87–5.11)
RDW: 12 % (ref 11.5–15.5)
WBC: 7.7 10*3/uL (ref 4.0–10.5)
nRBC: 0 % (ref 0.0–0.2)

## 2024-03-09 LAB — PREGNANCY, URINE: Preg Test, Ur: POSITIVE — AB

## 2024-03-09 LAB — URINALYSIS, ROUTINE W REFLEX MICROSCOPIC
Bilirubin Urine: NEGATIVE
Glucose, UA: NEGATIVE mg/dL
Hgb urine dipstick: NEGATIVE
Ketones, ur: NEGATIVE mg/dL
Leukocytes,Ua: NEGATIVE
Nitrite: NEGATIVE
Protein, ur: NEGATIVE mg/dL
Specific Gravity, Urine: 1.025 (ref 1.005–1.030)
pH: 7 (ref 5.0–8.0)

## 2024-03-09 LAB — COMPREHENSIVE METABOLIC PANEL WITH GFR
ALT: 27 U/L (ref 0–44)
AST: 33 U/L (ref 15–41)
Albumin: 3.9 g/dL (ref 3.5–5.0)
Alkaline Phosphatase: 301 U/L — ABNORMAL HIGH (ref 38–126)
Anion gap: 12 (ref 5–15)
BUN: 9 mg/dL (ref 6–20)
CO2: 19 mmol/L — ABNORMAL LOW (ref 22–32)
Calcium: 8.9 mg/dL (ref 8.9–10.3)
Chloride: 104 mmol/L (ref 98–111)
Creatinine, Ser: 0.46 mg/dL (ref 0.44–1.00)
GFR, Estimated: >60 mL/min (ref >60)
Glucose, Bld: 95 mg/dL (ref 70–99)
Potassium: 3.9 mmol/L (ref 3.5–5.1)
Sodium: 135 mmol/L (ref 135–145)
Total Bilirubin: 0.8 mg/dL (ref 0.0–1.2)
Total Protein: 7.3 g/dL (ref 6.5–8.1)

## 2024-03-09 LAB — LIPASE, BLOOD: Lipase: 28 U/L (ref 11–51)

## 2024-03-09 MED ORDER — ONDANSETRON 4 MG PO TBDP: 4.0000 mg | ORAL_TABLET | Freq: Three times a day (TID) | ORAL | 0 refills | Status: AC | PRN

## 2024-03-09 MED ORDER — ONDANSETRON HCL 4 MG/2ML IJ SOLN
4.0000 mg | Freq: Once | INTRAMUSCULAR | Status: AC | PRN
  Administered 2024-03-09: 4 mg via INTRAVENOUS
  Filled 2024-03-09: qty 2

## 2024-03-09 MED ORDER — ONDANSETRON HCL 4 MG/2ML IJ SOLN: 4.0000 mg | Freq: Once | INTRAMUSCULAR | Status: DC

## 2024-03-09 NOTE — Progress Notes (Addendum)
 RROB called to assess pt who is G1P0 at 27.[redacted]wks pregnant who came to [facility] complaining of nausea and left sided abdominal pain after eating some questionable chicken at [employer].  Pt reports that the pain in her stomach is intermittent but she hasn't felt it since she was given zofran about ago.  Pt reports positive fetal movement and denies LOF, vag bleeding, and UCs.  FHR monitors have been applied.  RROB to monitor remotely.

## 2024-03-09 NOTE — ED Notes (Signed)
 Spoke with Erin with OB rapid response.

## 2024-03-09 NOTE — Discharge Instructions (Addendum)
 You were seen in the emergency department today for concerns of abdominal pain and nausea.  Your labs were thankfully reassuring without any significant concerns screening.  I suspect you likely had some irritation from the food you ate during lunch today likely due to the fat content.  I am sending you home with Zofran for a short course if needed for severe nausea.  Please inform your OB/GYN that you are taking this as needed.  For any concerns of new or worsening symptoms, return to the emergency department.

## 2024-03-09 NOTE — ED Provider Notes (Signed)
 [place] EMERGENCY DEPARTMENT AT [facility] Provider Note   CSN: [id number] Arrival date & time: 03/09/24  1429     Patient presents with: Abdominal Pain   Kylie Bishop is a 23 y.o. female.  Patient with past history significant for recurrent pregnancy estimated to be about [redacted] weeks pregnant presents the emergency department with concerns of abdominal pain, nausea.  She reports that she began to feel ill with nausea and mild abdominal cramping starting around 1 PM this afternoon after eating lunch at a fast food restaurant around 11 AM.  She denies any recent fever, chills, body aches but does endorse some constipation with last bowel movement being 2 days ago.  She currently follows with [facility] through [facility] in [place], [place] .  At this point, she denies any complications with her current pregnancy.  She did remark that she has had a history of elevated liver enzymes since the start of her pregnancy but these enzymes have been downtrending but is still being closely monitored.  Denies any feelings of chest pain, shortness of breath, leg swelling, vision changes, or any inability to urinate.   Abdominal Pain Associated symptoms: nausea        Prior to Admission medications   Medication Sig Start Date End Date Taking? Authorizing Provider  ondansetron (ZOFRAN-ODT) 4 MG disintegrating tablet Take 1 tablet (4 mg total) by mouth every 8 (eight) hours as needed for nausea or vomiting. 03/09/24  Yes Yamin Swingler, Cathlyn Coast, PA-C  cefadroxil  (DURICEF) 500 MG capsule Take 1 capsule (500 mg total) by mouth 2 (two) times daily. 03/11/23   Hecker Butter, PA  fluticasone  (FLONASE ) 50 MCG/ACT nasal spray Place 1 spray into both nostrils daily. 07/05/22   LampteyDonley Furth, MD  gabapentin (NEURONTIN) 300 MG capsule Take 1 capsule by mouth as needed. 09/07/21   [provider]  ibuprofen  (ADVIL ,MOTRIN ) 400 MG tablet Take 1 tablet (400 mg total) by mouth  every 6 (six) hours as needed for headache, mild pain or moderate pain. 12/29/17   Jannine Meo, NP  magic mouthwash (lidocaine , diphenhydrAMINE , alum & mag hydroxide) suspension Swish and spit 5 mLs 4 (four) times daily as needed for mouth pain. 10/09/22   Afton Albright, MD  norelgestromin-ethinyl estradiol (XULANE) 150-35 MCG/24HR transdermal patch Place 1 patch onto the skin once a week. 05/24/22   [provider]  phenazopyridine  (PYRIDIUM ) 200 MG tablet Take 1 tablet (200 mg total) by mouth 3 (three) times daily as needed for pain. 03/11/23   [place] Butter, PA  promethazine -dextromethorphan (PROMETHAZINE -DM) 6.25-15 MG/5ML syrup Take 5 mLs by mouth 4 (four) times daily as needed for cough. 10/09/22   Afton Albright, MD    Allergies: Patient has no known allergies.    Review of Systems  Gastrointestinal:  Positive for nausea.  All other systems reviewed and are negative.   Updated Vital Signs BP 109/70   Pulse 70   Temp 98.2 F (36.8 C) (Oral)   Resp 16   Ht 5' 2 (1.575 m)   Wt 62.1 kg   LMP 02/28/2023   SpO2 99%   BMI 25.06 kg/m   Physical Exam Vitals and nursing note reviewed.  Constitutional:      General: She is not in acute distress.    Appearance: She is well-developed.  HENT:     Head: Normocephalic and atraumatic.   Eyes:     Conjunctiva/sclera: Conjunctivae normal.    Cardiovascular:  Rate and Rhythm: Normal rate and regular rhythm.     Heart sounds: No murmur heard. Pulmonary:     Effort: Pulmonary effort is normal. No respiratory distress.     Breath sounds: Normal breath sounds.  Abdominal:     General: Abdomen is protuberant. There is no distension.     Palpations: Abdomen is soft.     Tenderness: There is no abdominal tenderness.   Musculoskeletal:        General: No swelling.     Cervical back: Neck supple.   Skin:    General: Skin is warm and dry.     Capillary Refill: Capillary refill takes less than 2 seconds.    Neurological:     Mental Status: She is alert.   Psychiatric:        Mood and Affect: Mood normal.     (all labs ordered are listed, but only abnormal results are displayed) Labs Reviewed  COMPREHENSIVE METABOLIC PANEL WITH GFR - Abnormal; Notable for the following components:      Result Value   CO2 19 (*)    Alkaline Phosphatase 301 (*)    All other components within normal limits  CBC - Abnormal; Notable for the following components:   RBC 3.79 (*)    Hemoglobin 11.9 (*)    HCT 33.4 (*)    All other components within normal limits  PREGNANCY, URINE - Abnormal; Notable for the following components:   Preg Test, Ur POSITIVE (*)    All other components within normal limits  LIPASE, BLOOD  URINALYSIS, ROUTINE W REFLEX MICROSCOPIC    EKG: None  Radiology: No results found.  Procedures   Medications Ordered in the ED  ondansetron [facility]) injection 4 mg (0 mg Intravenous Hold 03/09/24 1709)  ondansetron (ZOFRAN) injection 4 mg (4 mg Intravenous Given 03/09/24 1520)                                  Medical Decision Making Amount and/or Complexity of Data Reviewed Labs: ordered.  Risk Prescription drug management.   This patient presents to the ED for concern of abdominal pain.  Differential diagnosis includes constipation, bowel obstruction, gastroenteritis, foodborne illness    Lab Tests:  I Ordered, and personally interpreted labs.  The pertinent results include: CBC shows slight decline in hemoglobin of 11.9 but anticipated due to pregnancy, CMP slight decline in CO2 at 19 but otherwise unremarkable with exception of elevation alkaline phosphatase at 301 but otherwise normal AST, ALT, and total bilirubin, lipase unremarkable at 28, urine pregnancy positive, UA negative for any signs of infection or other concerning findings   Medicines ordered and prescription drug management:  I ordered medication including Zofran for nausea Reevaluation of the patient  after these medicines showed that the patient improved I have reviewed the patients home medicines and have made adjustments as needed   Problem List / ED Course:  Patient presents the emergency department currently estimated to be [redacted] weeks pregnant here with concerns of nausea and abdominal discomfort.  She reports that she developed nausea about 2 hours after eating lunch at [employer].  She states that she feels that she could vomit but has not had any vomiting and denies any diarrhea.  No reported abdominal cramping, fevers, chills or bodyaches. On exam, there is no focal abdominal tenderness.  Normal bowel sounds.  Will proceed with lab workup and administer a one-time dose of  Zofran and reassess shortly. Lab workup is unremarkable with no signs of leukocytosis or leukopenia.  Hemoglobin slightly downtrending down to 11.9 but consistent with patient's reported history of pregnancy.  CMP unremarkable with slight elevation in alkaline phosphatase at 301.  Urine pregnancy is positive.  Lipase unremarkable at 28.  UA without signs of infection. On reassessment, she reports nausea has resolved after initial dose of Zofran.  Given that patient has been tolerating medication and has remained nausea free for greater than 2 hours, I advised that we can safely discharge her home with continued use of Zofran as needed.  At this time I am doubtful of constipation or bowel obstruction given that last bowel movement was 2 days ago.  Also doubt foodborne illness or gastroenteritis as patient has not had any vomiting or diarrhea and only some nausea has been present.  More likely that this is due to the fat content in the food that she had consumed. Advised patient to advise her PCP/OB/GYN that she is currently on Zofran for nausea given her current state of pregnancy.  Patient agreement with current plan verbalized understanding all return precautions.  She is otherwise stable at this time for outpatient follow-up and  discharged home.  Final diagnoses:  Nausea  Third trimester pregnancy    ED Discharge Orders          Ordered    ondansetron (ZOFRAN-ODT) 4 MG disintegrating tablet  Every 8 hours PRN        03/09/24 1732               Jayona Mccaig A, PA-C 03/09/24 1736    Floyd, Dan, DO 03/10/24 1500

## 2024-03-09 NOTE — Progress Notes (Signed)
 Dr Dodie Frees made aware of pt and status.  MD informed that fhr is reassuring for gestational age and that no UCs have been traced.  MD clears pt obstetrically.

## 2024-03-09 NOTE — ED Triage Notes (Signed)
 Pt POV reports eating chicken from [employer] around 1100- now feeling abd pain, nausea since 1300.  Denies known fever.   Pt currently 26 wk 4 days pregnant. Denies vaginal discharge, cramping.   OB- [facility]- G1P0.  Denies complications with pregnancy.
